# Patient Record
Sex: Female | Born: 2000 | Race: White | Hispanic: No | Marital: Single | Smoking: Never smoker
Health system: Southern US, Community
[De-identification: ages and names within clinical notes are randomized; demographics above are authoritative.]

## PROBLEM LIST (undated history)

## (undated) DIAGNOSIS — L309 Dermatitis, unspecified: Secondary | ICD-10-CM

---
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
## 2019-01-26 ENCOUNTER — Encounter: Payer: Self-pay | Admitting: Certified Nurse Midwife

## 2020-05-07 ENCOUNTER — Telehealth: Payer: 59 | Admitting: Physician Assistant

## 2020-05-07 ENCOUNTER — Encounter: Payer: Self-pay | Admitting: Physician Assistant

## 2020-05-07 DIAGNOSIS — N3000 Acute cystitis without hematuria: Secondary | ICD-10-CM

## 2020-05-07 MED ORDER — NITROFURANTOIN MONOHYD MACRO 100 MG PO CAPS: 100.0000 mg | ORAL_CAPSULE | Freq: Two times a day (BID) | ORAL | 0 refills | Status: AC

## 2020-05-07 NOTE — Progress Notes (Signed)
I connected with  Betty Stone on 05/07/20 by a video enabled telemedicine application and verified that I am speaking with the correct person using two identifiers.   I discussed the limitations of evaluation and management by telemedicine. The patient expressed understanding and agreed to proceed.    New Patient Office Visit  Subjective:  Patient ID: Betty Stone, female    DOB: 17-Mar-2001  Age: 20 y.o. MRN: [id number]  CC:  Chief Complaint  Patient presents with  . Urinary Tract Infection   Virtual Visit via Video Note  I connected with Betty Stone on 05/07/20 at  2:15 PM EST by a video enabled telemedicine application and verified that I am speaking with the correct person using two identifiers.  Patient was unable to connect through video, visit was conducted through audio only  Location: Patient: Home Provider: Working remotely from home   I discussed the limitations of evaluation and management by telemedicine and the availability of in person appointments. The patient expressed understanding and agreed to proceed.  History of Present Illness: Reports that she started having dysuria, increased frequency, urgency approximately 2 weeks ago.  Reports that she had an IUD inserted approximately 2 weeks ago as well.  Reports that she has been using AZO, increasing water intake with little relief.  Reports that she took an over-the-counter urinary tract infection test that showed that she was positive for a UTI.  Denies fever, back pain, vaginal discharge, change in odor   Observations/Objective: Medical history and current medications reviewed, no physical exam completed     Past Medical History:  Diagnosis Date  . Eczema     History reviewed. No pertinent surgical history.  Family History  Problem Relation Age of Onset  . Cancer Mother   . Breast cancer Maternal Grandmother   . Ovarian cancer Neg Hx   . Colon cancer Neg Hx     Social History   Socioeconomic  History  . Marital status: Single    Spouse name: Not on file  . Number of children: Not on file  . Years of education: Not on file  . Highest education level: Not on file  Occupational History  . Not on file  Tobacco Use  . Smoking status: Never Smoker  . Smokeless tobacco: Never Used  Vaping Use  . Vaping Use: Never used  Substance and Sexual Activity  . Alcohol use: No  . Drug use: No  . Sexual activity: Not Currently    Birth control/protection: Pill  Other Topics Concern  . Not on file  Social History Narrative  . Not on file   Social Determinants of Health   Financial Resource Strain: Not on file  Food Insecurity: Not on file  Transportation Needs: Not on file  Physical Activity: Not on file  Stress: Not on file  Social Connections: Not on file  Intimate Partner Violence: Not on file    ROS Review of Systems  Constitutional: Negative for chills and fever.  HENT: Negative.   Eyes: Negative.   Respiratory: Negative.   Cardiovascular: Negative.   Gastrointestinal: Negative for abdominal pain, nausea and vomiting.  Endocrine: Negative.   Genitourinary: Positive for dysuria, frequency and hematuria. Negative for genital sores, vaginal bleeding and vaginal discharge.  Musculoskeletal: Negative for back pain.  Skin: Negative.   Allergic/Immunologic: Negative.   Neurological: Negative for headaches.  Hematological: Negative.   Psychiatric/Behavioral: Negative.     Objective:   Today's Vitals: There were no vitals taken for this visit.  Assessment & Plan:   Problem List Items Addressed This Visit   None   Visit Diagnoses    Acute cystitis without hematuria    -  Primary   Relevant Medications   nitrofurantoin, macrocrystal-monohydrate, (MACROBID) 100 MG capsule      Outpatient Encounter Medications as of 05/07/2020  Medication Sig  . nitrofurantoin, macrocrystal-monohydrate, (MACROBID) 100 MG capsule Take 1 capsule (100 mg total) by mouth 2 (two)  times daily for 7 days.  . DENTA 5000 PLUS 1.1 % CREA dental cream BRUSH ONCE DAILY IN PLACE OF REGULAR TOOTHPASTE  . ibuprofen (ADVIL,MOTRIN) 600 MG tablet Take 1 tablet (600 mg total) by mouth every 8 (eight) hours as needed.  Marland Kitchen levonorgestrel-ethinyl estradiol (ALTAVERA) 0.15-30 MG-MCG tablet Take 1 tablet by mouth daily.  . naproxen (NAPROSYN) 250 MG tablet Take by mouth as needed.    No facility-administered encounter medications on file as of 05/07/2020.    Assessment and Plan: 1. Acute cystitis without hematuria Patient education given, explained to patient that since this was a virtual visit, we would not be able to send her urine for a culture for verification of bacteria, patient understands and agrees.  Trial Macrobid, continue symptomatic treatment.  Red flags given for prompt reevaluation - nitrofurantoin, macrocrystal-monohydrate, (MACROBID) 100 MG capsule; Take 1 capsule (100 mg total) by mouth 2 (two) times daily for 7 days.  Dispense: 14 capsule; Refill: 0   Follow Up Instructions:    I discussed the assessment and treatment plan with the patient. The patient was provided an opportunity to ask questions and all were answered. The patient agreed with the plan and demonstrated an understanding of the instructions.   The patient was advised to call back or seek an in-person evaluation if the symptoms worsen or if the condition fails to improve as anticipated.  I provided 20 minutes of face-to-face and non-face-to-face time during this encounter.    Follow-up: No follow-ups on file.   Kasandra Knudsen Lankford Gutzmer, PA-C

## 2020-05-07 NOTE — Patient Instructions (Signed)
You will take Macrobid twice a day for the next 7 days, I encourage you to continue drinking plenty of water and getting plenty of rest.  Please let us know if there is anything else we can do for you  Roney Jaffe, PA-C Physician Assistant [facility] [email or web address]    Urinary Tract Infection, Adult  A urinary tract infection (UTI) is an infection of any part of the urinary tract. The urinary tract includes the kidneys, ureters, bladder, and urethra. These organs make, store, and get rid of urine in the body. An upper UTI affects the ureters and kidneys. A lower UTI affects the bladder and urethra. What are the causes? Most urinary tract infections are caused by bacteria in your genital area around your urethra, where urine leaves your body. These bacteria grow and cause inflammation of your urinary tract. What increases the risk? You are more likely to develop this condition if:  You have a urinary catheter that stays in place.  You are not able to control when you urinate or have a bowel movement (incontinence).  You are female and you: ? Use a spermicide or diaphragm for birth control. ? Have low estrogen levels. ? Are pregnant.  You have certain genes that increase your risk.  You are sexually active.  You take antibiotic medicines.  You have a condition that causes your flow of urine to slow down, such as: ? An enlarged prostate, if you are female. ? Blockage in your urethra. ? A kidney stone. ? A nerve condition that affects your bladder control (neurogenic bladder). ? Not getting enough to drink, or not urinating often.  You have certain medical conditions, such as: ? Diabetes. ? A weak disease-fighting system (immunesystem). ? Sickle cell disease. ? Gout. ? Spinal cord injury. What are the signs or symptoms? Symptoms of this condition include:  Needing to urinate right away (urgency).  Frequent  urination. This may include small amounts of urine each time you urinate.  Pain or burning with urination.  Blood in the urine.  Urine that smells bad or unusual.  Trouble urinating.  Cloudy urine.  Vaginal discharge, if you are female.  Pain in the abdomen or the lower back. You may also have:  Vomiting or a decreased appetite.  Confusion.  Irritability or tiredness.  A fever or chills.  Diarrhea. The first symptom in older adults may be confusion. In some cases, they may not have any symptoms until the infection has worsened. How is this diagnosed? This condition is diagnosed based on your medical history and a physical exam. You may also have other tests, including:  Urine tests.  Blood tests.  Tests for STIs (sexually transmitted infections). If you have had more than one UTI, a cystoscopy or imaging studies may be done to determine the cause of the infections. How is this treated? Treatment for this condition includes:  Antibiotic medicine.  Over-the-counter medicines to treat discomfort.  Drinking enough water to stay hydrated. If you have frequent infections or have other conditions such as a kidney stone, you may need to see a health care provider who specializes in the urinary tract (urologist). In rare cases, urinary tract infections can cause sepsis. Sepsis is a life-threatening condition that occurs when the body responds to an infection. Sepsis is treated in the hospital with IV antibiotics, fluids, and other medicines. Follow these instructions at home: Medicines  Take over-the-counter and prescription medicines only as told by your health care provider.  If you were prescribed an antibiotic medicine, take it as told by your health care provider. Do not stop using the antibiotic even if you start to feel better. General instructions  Make sure you: ? Empty your bladder often and completely. Do not hold urine for long periods of time. ? Empty your  bladder after sex. ? Wipe from front to back after urinating or having a bowel movement if you are female. Use each tissue only one time when you wipe.  Drink enough fluid to keep your urine pale yellow.  Keep all follow-up visits. This is important.   Contact a health care provider if:  Your symptoms do not get better after 1-2 days.  Your symptoms go away and then return. Get help right away if:  You have severe pain in your back or your lower abdomen.  You have a fever or chills.  You have nausea or vomiting. Summary  A urinary tract infection (UTI) is an infection of any part of the urinary tract, which includes the kidneys, ureters, bladder, and urethra.  Most urinary tract infections are caused by bacteria in your genital area.  Treatment for this condition often includes antibiotic medicines.  If you were prescribed an antibiotic medicine, take it as told by your health care provider. Do not stop using the antibiotic even if you start to feel better.  Keep all follow-up visits. This is important. This information is not intended to replace advice given to you by your health care provider. Make sure you discuss any questions you have with your health care provider. Document Revised: 10/29/2019 Document Reviewed: 10/29/2019 Elsevier Patient Education  2021 [employer].

## 2020-05-08 ENCOUNTER — Emergency Department (HOSPITAL_BASED_OUTPATIENT_CLINIC_OR_DEPARTMENT_OTHER): Payer: 59

## 2020-05-08 ENCOUNTER — Emergency Department (HOSPITAL_BASED_OUTPATIENT_CLINIC_OR_DEPARTMENT_OTHER)
Admission: EM | Admit: 2020-05-08 | Discharge: 2020-05-08 | Disposition: A | Discharge status: Home / Self Care | Payer: 59 | Attending: Emergency Medicine | Admitting: Emergency Medicine

## 2020-05-08 ENCOUNTER — Encounter (HOSPITAL_BASED_OUTPATIENT_CLINIC_OR_DEPARTMENT_OTHER): Payer: Self-pay | Admitting: *Deleted

## 2020-05-08 ENCOUNTER — Other Ambulatory Visit: Payer: Self-pay

## 2020-05-08 DIAGNOSIS — M542 Cervicalgia: Secondary | ICD-10-CM | POA: Diagnosis present

## 2020-05-08 DIAGNOSIS — M545 Low back pain, unspecified: Secondary | ICD-10-CM | POA: Insufficient documentation

## 2020-05-08 DIAGNOSIS — Y92410 Unspecified street and highway as the place of occurrence of the external cause: Secondary | ICD-10-CM | POA: Insufficient documentation

## 2020-05-08 MED ORDER — ACETAMINOPHEN 325 MG PO TABS
650.0000 mg | ORAL_TABLET | Freq: Once | ORAL | Status: AC
  Administered 2020-05-08: 650 mg via ORAL
  Filled 2020-05-08: qty 2

## 2020-05-08 NOTE — Discharge Instructions (Signed)
Call your primary care doctor or specialist as discussed in the next 2-3 days.   Return immediately back to the ER if:  Your symptoms worsen within the next 12-24 hours. You develop new symptoms such as new fevers, persistent vomiting, new pain, shortness of breath, or new weakness or numbness, or if you have any other concerns.  
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
## 2020-05-28 ENCOUNTER — Telehealth: Payer: 59 | Admitting: Oncology

## 2020-05-28 DIAGNOSIS — N3000 Acute cystitis without hematuria: Secondary | ICD-10-CM | POA: Diagnosis not present

## 2020-05-28 MED ORDER — SULFAMETHOXAZOLE-TRIMETHOPRIM 800-160 MG PO TABS: 1.0000 | ORAL_TABLET | Freq: Two times a day (BID) | ORAL | 0 refills | Status: DC

## 2020-05-28 MED ORDER — PHENAZOPYRIDINE HCL 100 MG PO TABS: 100.0000 mg | ORAL_TABLET | Freq: Three times a day (TID) | ORAL | 0 refills | Status: DC | PRN

## 2020-05-28 NOTE — Patient Instructions (Signed)
Phenazopyridine tablets What is this medicine? PHENAZOPYRIDINE (fen az oh PEER i deen) is a pain reliever. It is used to stop the pain, burning, or discomfort caused by infection or irritation of the urinary tract. This medicine is not an antibiotic. It will not cure a urinary tract infection. This medicine may be used for other purposes; ask your health care provider or pharmacist if you have questions. COMMON BRAND NAME(S): AZO, Azo-100, Azo-Gesic, Azo-Septic, Azo-Standard, Phenazo, Prodium, Pyridium, Urinary Analgesic, Uristat, Uristat Ultra What should I tell my health care provider before I take this medicine? They need to know if you have any of these conditions:  glucose-6-phosphate dehydrogenase (G6PD) deficiency  kidney disease  an unusual or allergic reaction to phenazopyridine, other medicines, foods, dyes, or preservatives  pregnant or trying to get pregnant  breast-feeding How should I use this medicine? Take this medicine by mouth with a glass of water. Follow the directions on the prescription label. Take after meals. Take your doses at regular intervals. Do not take your medicine more often than directed. Do not skip doses or stop your medicine early even if you feel better. Do not stop taking except on your doctor's advice. Talk to your pediatrician regarding the use of this medicine in children. Special care may be needed. Overdosage: If you think you have taken too much of this medicine contact a poison control center or emergency room at once. NOTE: This medicine is only for you. Do not share this medicine with others. What if I miss a dose? If you miss a dose, take it as soon as you can. If it is almost time for your next dose, take only that dose. Do not take double or extra doses. What may interact with this medicine? Interactions are not expected. This list may not describe all possible interactions. Give your health care provider a list of all the medicines, herbs,  non-prescription drugs, or dietary supplements you use. Also tell them if you smoke, drink alcohol, or use illegal drugs. Some items may interact with your medicine. What should I watch for while using this medicine? Tell your doctor or health care professional if your symptoms do not improve or if they get worse. This medicine colors body fluids red. This effect is harmless and will go away after you are done taking the medicine. It will change urine to an dark orange or red color. The red color may stain clothing. Soft contact lenses may become permanently stained. It is best not to wear soft contact lenses while taking this medicine. If you are diabetic you may get a false positive result for sugar in your urine. Talk to your health care provider. What side effects may I notice from receiving this medicine? Side effects that you should report to your doctor or health care professional as soon as possible:  allergic reactions like skin rash, itching or hives, swelling of the face, lips, or tongue  blue or purple color of the skin  difficulty breathing  fever  less urine  unusual bleeding, bruising  unusual tired, weak  vomiting  yellowing of the eyes or skin Side effects that usually do not require medical attention (report to your doctor or health care professional if they continue or are bothersome):  dark urine  headache  stomach upset This list may not describe all possible side effects. Call your doctor for medical advice about side effects. You may report side effects to FDA at 1-800-FDA-1088. Where should I keep my medicine? Keep  out of the reach of children. Store at room temperature between 15 and 30 degrees C (59 and 86 degrees F). Protect from light and moisture. Throw away any unused medicine after the expiration date. NOTE: This sheet is a summary. It may not cover all possible information. If you have questions about this medicine, talk to your doctor, pharmacist,  or health care provider.  2021 Elsevier/Gold Standard (2007-10-15 11:04:07) Sulfamethoxazole; Trimethoprim, SMX-TMP tablets What is this medicine? SULFAMETHOXAZOLE; TRIMETHOPRIM or SMX-TMP (suhl fuh meth OK suh zohl; trye METH oh prim) is a combination of a sulfonamide antibiotic and a second antibiotic, trimethoprim. It is used to treat or prevent certain kinds of bacterial infections. It will not work for colds, flu, or other viral infections. This medicine may be used for other purposes; ask your health care provider or pharmacist if you have questions. COMMON BRAND NAME(S): Bacter-Aid DS, Bactrim, Bactrim DS, Septra, Septra DS What should I tell my health care provider before I take this medicine? They need to know if you have any of these conditions:  G6PD deficiency  HIV or AIDS  kidney disease  liver disease  low platelets  low red blood cell counts  poor nutrition  stomach or intestine problems like colitis  thyroid disease  an unusual or allergic reaction to sulfamethoxazole, trimethoprim, sulfa drugs, other drugs, foods, dyes, or preservatives  pregnant or trying to get pregnant  breast-feeding How should I use this medicine? Take this medicine by mouth with a glass of water. Follow the directions on the prescription label. Take your medicine at regular intervals. Do not take it more often than directed. Take all of your medicine as directed even if you think you are better. Do not skip doses or stop your medicine early. Talk to your pediatrician regarding the use of this medicine in children. While this drug may be prescribed for children as young as 2 months for selected conditions, precautions do apply. Overdosage: If you think you have taken too much of this medicine contact a poison control center or emergency room at once. NOTE: This medicine is only for you. Do not share this medicine with others. What if I miss a dose? If you miss a dose, take it as soon as  you can. If it is almost time for your next dose, take only that dose. Do not take double or extra doses. What may interact with this medicine? Do not take this medicine with any of the following medications:  dofetilide This medicine may also interact with the following medications:  amantadine  birth control pills  certain medicines for blood pressure, heart disease  certain medicines for depression, like amitriptyline  certain medicines for diabetes, like glipizide or glyburide  certain medicines that treat or prevent blood clots like warfarin  cyclosporine  digoxin  diuretics  indomethacin  methotrexate  phenytoin  procainamide  pyrimethamine  zidovudine This list may not describe all possible interactions. Give your health care provider a list of all the medicines, herbs, non-prescription drugs, or dietary supplements you use. Also tell them if you smoke, drink alcohol, or use illegal drugs. Some items may interact with your medicine. What should I watch for while using this medicine? Tell your health care provider if your symptoms do not start to get better or if they get worse. Do not treat diarrhea with over the counter products. Contact your health care provider if you have diarrhea that lasts more than 2 days or if it is severe and  watery. This drug may cause serious skin reactions. They can happen weeks to months after starting the drug. Contact your health care provider right away if you notice fevers or flu-like symptoms with a rash. The rash may be red or purple and then turn into blisters or peeling of the skin. Or, you might notice a red rash with swelling of the face, lips or lymph nodes in your neck or under your arms. This drug can make you more sensitive to the sun. Keep out of the sun. If you cannot avoid being in the sun, wear protective clothing and sunscreen. Do not use sun lamps or tanning beds/booths. Be careful brushing or flossing your teeth or  using a toothpick because you may get an infection or bleed more easily. If you have any dental work done, tell your dentist you are receiving this drug. What side effects may I notice from receiving this medicine? Side effects that you should report to your doctor or health care professional as soon as possible:  allergic reactions (skin rash, itching or hives; swelling of the face, lips, or tongue)  bloody or watery diarrhea  cough  heartbeat rhythm changes (trouble breathing; chest pain; dizziness; fast, irregular heartbeat; feeling faint or lightheaded, falls,)  fever  high potassium levels (chest pain; fast, irregular heartbeat; muscle weakness)  kidney injury (trouble passing urine or change in the amount of urine)  liver injury (dark yellow or brown urine; general ill feeling or flu-like symptoms; loss of appetite, right upper belly pain; unusually weak or tired, yellowing of the eyes or skin)  low blood pressure (dizziness; feeling faint or lightheaded, falls; unusually weak or tired)  low blood sugar (feeling anxious; confusion; dizziness; increased hunger; unusually weak or tired; increased sweating; shakiness; cold, clammy skin; irritable; headache; blurred vision; fast heartbeat; loss of consciousness)  low red blood cell counts (trouble breathing; feeling faint; lightheaded, falls; unusually weak or tired)  rash, fever, and swollen lymph nodes  redness, blistering, peeling, or loosening of the skin, including inside the mouth  trouble breathing  unusual bruising or bleeding Side effects that usually do not require medical attention (report to your doctor or health care professional if they continue or are bothersome):  lack or loss of appetite  nausea  vomiting This list may not describe all possible side effects. Call your doctor for medical advice about side effects. You may report side effects to FDA at 1-800-FDA-1088. Where should I keep my medicine? Keep  out of the reach of children. Store between 15 and 25 degrees C (59 to 77 degrees F). Protect from light. Keep the container tightly closed. Throw away any unused drug after the expiration date. NOTE: This sheet is a summary. It may not cover all possible information. If you have questions about this medicine, talk to your doctor, pharmacist, or health care provider.  2021 Elsevier/Gold Standard (2019-02-19 09:54:22)

## 2020-05-28 NOTE — Progress Notes (Signed)
[name], Betty Stone are scheduled for a virtual visit with your provider today.    Just as we do with appointments in the office, we must obtain your consent to participate.  Your consent will be active for this visit and any virtual visit you may have with one of our providers in the next 365 days.    If you have a MyChart account, I can also send a copy of this consent to you electronically.  All virtual visits are billed to your insurance company just like a traditional visit in the office.  As this is a virtual visit, video technology does not allow for your provider to perform a traditional examination.  This may limit your provider's ability to fully assess your condition.  If your provider identifies any concerns that need to be evaluated in person or the need to arrange testing such as labs, EKG, etc, we will make arrangements to do so.    Although advances in technology are sophisticated, we cannot ensure that it will always work on either your end or our end.  If the connection with a video visit is poor, we may have to switch to a telephone visit.  With either a video or telephone visit, we are not always able to ensure that we have a secure connection.   I need to obtain your verbal consent now.   Are you willing to proceed with your visit today?   Betty Stone has provided verbal consent on 05/28/2020 for a virtual visit (video or telephone).   Mauro Kaufmann, NP 05/28/2020  6:23 PM  Virtual Visit via Video Note  I connected with Betty Stone on 05/28/20 at  6:15 PM EST by a video enabled telemedicine application and verified that I am speaking with the correct person using two identifiers.  Location: Patient: Home Provider: Home Office   I discussed the limitations of evaluation and management by telemedicine and the availability of in person appointments. The patient expressed understanding and agreed to proceed.  History of Present Illness: Betty Stone is a 20 year old female with  past medical history significant for acute cystitis without hematuria and just menorrhagia in adolescence who presents for [facility] virtual visit today for symptoms of a UTI.  She was recently seen at [facility].  Imaging was negative.  States she was recently seen for UTI-like symptoms and given a prescription for nitrofurantoin.  Symptoms improved after a few days and she stopped taking her antibiotic.  This was approximately 2 weeks ago.  Symptoms started to return 1 to 2 days ago.  States she has developed some dysuria but no increased urgency or hematuria.  Denies any fever or pelvic pain.  She is worried the urinary tract infection will come back.   Observations/Objective: Review of Systems  Genitourinary: Positive for dysuria. Negative for flank pain, frequency, hematuria and urgency.   Assessment and Plan: Rebound urinary tract infection: -Secondary to not completing course of nitrofurantoin. -Took a home UTI test which was positive over 2 to 3 weeks ago. -Denies any fever, back pain, vaginal discharge or odor. -Recommend Bactrim twice daily for 5 to 7 days.  Counseled on importance of completing course of antibiotics. -Recommend Pyridium for dysuria.  Counseled that this medication will turn her urine orange. -Both prescription sent to pharmacy.  Follow Up Instructions: -Recommend follow-up with PCP.   I discussed the assessment and treatment plan with the patient. The patient was provided an opportunity to ask questions and  all were answered. The patient agreed with the plan and demonstrated an understanding of the instructions.   The patient was advised to call back or seek an in-person evaluation if the symptoms worsen or if the condition fails to improve as anticipated.  I provided 9 minutes of face-to-face time during this encounter.   Mauro Kaufmann, NP

## 2021-01-09 ENCOUNTER — Telehealth: Payer: 59 | Admitting: Physician Assistant

## 2021-01-09 DIAGNOSIS — B9689 Other specified bacterial agents as the cause of diseases classified elsewhere: Secondary | ICD-10-CM

## 2021-01-09 DIAGNOSIS — J019 Acute sinusitis, unspecified: Secondary | ICD-10-CM

## 2021-01-09 MED ORDER — DOXYCYCLINE HYCLATE 100 MG PO TABS: 100.0000 mg | ORAL_TABLET | Freq: Two times a day (BID) | ORAL | 0 refills | Status: DC

## 2021-01-09 NOTE — Progress Notes (Signed)

## 2021-01-09 NOTE — Progress Notes (Signed)
I have spent 5 minutes in review of e-visit questionnaire, review and updating patient chart, medical decision making and response to patient.   Cleopha Indelicato Cody Kimari Lienhard, PA-C    

## 2021-01-19 ENCOUNTER — Telehealth: Payer: 59 | Admitting: Physician Assistant

## 2021-01-19 DIAGNOSIS — J020 Streptococcal pharyngitis: Secondary | ICD-10-CM

## 2021-01-19 MED ORDER — LIDOCAINE VISCOUS HCL 2 % MT SOLN: OROMUCOSAL | 0 refills | Status: DC

## 2021-01-19 MED ORDER — PREDNISONE 20 MG PO TABS: 20.0000 mg | ORAL_TABLET | Freq: Every day | ORAL | 0 refills | Status: DC

## 2021-01-19 MED ORDER — AZITHROMYCIN 250 MG PO TABS: ORAL_TABLET | ORAL | 0 refills | Status: DC

## 2021-01-19 MED ORDER — IPRATROPIUM BROMIDE 0.03 % NA SOLN: 2.0000 | Freq: Two times a day (BID) | NASAL | 0 refills | Status: DC

## 2021-01-19 NOTE — Patient Instructions (Signed)
[employer], thank you for joining Margaretann Loveless, PA-C for today's virtual visit.  While this provider is not your primary care provider (PCP), if your PCP is located in our provider database this encounter information will be shared with them immediately following your visit.  Consent: (Patient) Yesly Hyslop provided verbal consent for this virtual visit at the beginning of the encounter.  Current Medications:  Current Outpatient Medications:    azithromycin (ZITHROMAX) 250 MG tablet, Take 2 tablets PO on day one, and one tablet PO daily thereafter until completed., Disp: 6 tablet, Rfl: 0   ipratropium (ATROVENT) 0.03 % nasal spray, Place 2 sprays into both nostrils every 12 (twelve) hours., Disp: 30 mL, Rfl: 0   lidocaine (XYLOCAINE) 2 % solution, 5 mL swish and gargle for 15-20 seconds then swallow every 4 hours as needed for throat pain, Disp: 100 mL, Rfl: 0   predniSONE (DELTASONE) 20 MG tablet, Take 1 tablet (20 mg total) by mouth daily with breakfast., Disp: 5 tablet, Rfl: 0   doxycycline (VIBRA-TABS) 100 MG tablet, Take 1 tablet (100 mg total) by mouth 2 (two) times daily., Disp: 20 tablet, Rfl: 0   phenazopyridine (PYRIDIUM) 100 MG tablet, Take 1 tablet (100 mg total) by mouth 3 (three) times daily as needed for pain., Disp: 10 tablet, Rfl: 0   Medications ordered in this encounter:  Meds ordered this encounter  Medications   azithromycin (ZITHROMAX) 250 MG tablet    Sig: Take 2 tablets PO on day one, and one tablet PO daily thereafter until completed.    Dispense:  6 tablet    Refill:  0    Order Specific Question:   Supervising Provider    Answer:   MILLER, BRIAN [[id number]]   predniSONE (DELTASONE) 20 MG tablet    Sig: Take 1 tablet (20 mg total) by mouth daily with breakfast.    Dispense:  5 tablet    Refill:  0    Order Specific Question:   Supervising Provider    Answer:   MILLER, BRIAN [[id number]]   lidocaine (XYLOCAINE) 2 % solution    Sig: 5 mL swish and gargle for  15-20 seconds then swallow every 4 hours as needed for throat pain    Dispense:  100 mL    Refill:  0    Order Specific Question:   Supervising Provider    Answer:   MILLER, BRIAN [[id number]]   ipratropium (ATROVENT) 0.03 % nasal spray    Sig: Place 2 sprays into both nostrils every 12 (twelve) hours.    Dispense:  30 mL    Refill:  0    Order Specific Question:   Supervising Provider    Answer:   Hyacinth Meeker, BRIAN [[id number]]     *If you need refills on other medications prior to your next appointment, please contact your pharmacy*  Follow-Up: Call back or seek an in-person evaluation if the symptoms worsen or if the condition fails to improve as anticipated.  Other Instructions Strep Throat, Adult Strep throat is an infection in the throat that is caused by bacteria. It is common during the cold months of the year. It mostly affects children who are 63-26 years old. However, people of all ages can get it at any time of the year. This infection spreads from person to person (is contagious) through coughing, sneezing, or having close contact. Your health care provider may use other names to describe the infection. It can be called tonsillitis (if there is swelling  of the tonsils), or pharyngitis (if there is swelling at the back of the throat). What are the causes? This condition is caused by the Streptococcus pyogenes bacteria. What increases the risk? You are more likely to develop this condition if: You care for school-age children, or are around school-age children. Children are more likely to get strep throat and may spread it to others. You spend time in crowded places where the infection can spread easily. You have close contact with someone who has strep throat. What are the signs or symptoms? Symptoms of this condition include: Fever or chills. Redness, swelling, or pain in the tonsils or throat. Pain or difficulty when swallowing. White or yellow spots on the tonsils or throat. Tender  glands in the neck and under the jaw. Bad smelling breath. Red rash all over the body. This is rare. How is this diagnosed? This condition is diagnosed by tests that check for the presence and the amount of bacteria that cause strep throat. They are: Rapid strep test. Your throat is swabbed and checked for the presence of bacteria. Results are usually ready in minutes. Throat culture test. Your throat is swabbed. The sample is placed in a cup that allows infections to grow. Results are usually ready in 1 or 2 days. How is this treated? This condition may be treated with: Medicines that kill germs (antibiotics). Medicines that relieve pain or fever. These include: Ibuprofen or acetaminophen. Aspirin, only for patients who are over the age of 34. Throat lozenges. Throat sprays. Follow these instructions at home: Medicines  Take over-the-counter and prescription medicines only as told by your health care provider. Take your antibiotic medicine as told by your health care provider. Do not stop taking the antibiotic even if you start to feel better. Eating and drinking  If you have trouble swallowing, try eating soft foods until your sore throat feels better. Drink enough fluid to keep your urine pale yellow. To help relieve pain, you may have: Warm fluids, such as soup and tea. Cold fluids, such as frozen desserts or popsicles. General instructions Gargle with a salt-water mixture 3-4 times a day or as needed. To make a salt-water mixture, completely dissolve -1 tsp (3-6 g) of salt in 1 cup (237 mL) of warm water. Get plenty of rest. Stay home from work or school until you have been taking antibiotics for 24 hours. Avoid smoking or being around people who smoke. Keep all follow-up visits as told by your health care provider. This is important. How is this prevented?  Do not share food, drinking cups, or personal items that could cause the infection to spread to other people. Wash  your hands well with soap and water, and make sure that all people in your house wash their hands well. Have family members tested if they have a sore throat or fever. They may need an antibiotic if they have strep throat. Contact a health care provider if: The glands in your neck continue to get bigger. You develop a rash, cough, or earache. You cough up a thick mucus that is green, yellow-brown, or bloody. You have pain or discomfort that does not get better with medicine. Your symptoms seem to be getting worse and not better. You have a fever. Get help right away if: You have new symptoms, such as vomiting, severe headache, stiff or painful neck, chest pain, or shortness of breath. You have severe throat pain, drooling, or changes in your voice. You have swelling of the neck,  or the skin on the neck becomes red and tender. You have signs of dehydration, such as tiredness (fatigue), dry mouth, and decreased urination. You become increasingly sleepy, or you cannot wake up completely. Your joints become red or painful. Summary Strep throat is an infection in the throat that is caused by the Streptococcus pyogenes bacteria. This infection is spread from person to person (is contagious) through coughing, sneezing, or having close contact. Take your medicines, including antibiotics, as told by your health care provider. Do not stop taking the antibiotic even if you start to feel better. To prevent the spread of germs, wash your hands well with soap and water. Have others do the same. Do not share food, drinking cups, or personal items. Get help right away if you have new symptoms, such as vomiting, severe headache, stiff or painful neck, chest pain, or shortness of breath. This information is not intended to replace advice given to you by your health care provider. Make sure you discuss any questions you have with your health care provider. Document Revised: 06/05/2018 Document Reviewed:  06/05/2018 Elsevier Patient Education  2022 [employer].    If you have been instructed to have an in-person evaluation today at a local Urgent Care facility, please use the link below. It will take you to a list of all of our available [facility], including address, phone number and hours of operation. Please do not delay care.  [facility]  If you or a family member do not have a primary care provider, use the link below to schedule a visit and establish care. When you choose a [facility] primary care physician or advanced practice provider, you gain a long-term partner in health. Find a Primary Care Provider  Learn more about [facility]'s in-office and virtual care options: [facility] - Get Care Now

## 2021-01-19 NOTE — Progress Notes (Signed)
Virtual Visit Consent   Betty Stone, you are scheduled for a virtual visit with a [facility] provider today.     Just as with appointments in the office, your consent must be obtained to participate.  Your consent will be active for this visit and any virtual visit you may have with one of our providers in the next 365 days.     If you have a MyChart account, a copy of this consent can be sent to you electronically.  All virtual visits are billed to your insurance company just like a traditional visit in the office.    As this is a virtual visit, video technology does not allow for your provider to perform a traditional examination.  This may limit your provider's ability to fully assess your condition.  If your provider identifies any concerns that need to be evaluated in person or the need to arrange testing (such as labs, EKG, etc.), we will make arrangements to do so.     Although advances in technology are sophisticated, we cannot ensure that it will always work on either your end or our end.  If the connection with a video visit is poor, the visit may have to be switched to a telephone visit.  With either a video or telephone visit, we are not always able to ensure that we have a secure connection.     I need to obtain your verbal consent now.   Are you willing to proceed with your visit today?    Kinleigh Glasby has provided verbal consent on 01/19/2021 for a virtual visit (video or telephone).   Margaretann Loveless, PA-C   Date: 01/19/2021 3:47 PM   Virtual Visit via Video Note   I, Margaretann Loveless, connected with  Betty Stone  ([id number], 11-25-2000) on 01/19/21 at  3:15 PM EDT by a video-enabled telemedicine application and verified that I am speaking with the correct person using two identifiers.  Location: Patient: Virtual Visit Location Patient: Home Provider: Virtual Visit Location Provider: Home Office   I discussed the limitations of evaluation and management by  telemedicine and the availability of in person appointments. The patient expressed understanding and agreed to proceed.    History of Present Illness: Betty Stone is a 20 y.o. who identifies as a female who was assigned female at birth, and is being seen today for continued sore throat issues. Reports she has had a sore throat for several weeks. Was seen on 01/09/21 and treated with Doxycycline for 10 days. Reports was starting to improve but worsened once stopping doxycycline. Does have continued post nasal drainage and rhinorrhea. Also with pain radiating into ears.    Problems:  Patient Active Problem List   Diagnosis Date Noted   Acute cystitis without hematuria 05/07/2020   Dysmenorrhea in adolescent 09/25/2017    Allergies:  Allergies  Allergen Reactions   Amoxicillin    Cefdinir Rash   Medications:  Current Outpatient Medications:    azithromycin (ZITHROMAX) 250 MG tablet, Take 2 tablets PO on day one, and one tablet PO daily thereafter until completed., Disp: 6 tablet, Rfl: 0   ipratropium (ATROVENT) 0.03 % nasal spray, Place 2 sprays into both nostrils every 12 (twelve) hours., Disp: 30 mL, Rfl: 0   lidocaine (XYLOCAINE) 2 % solution, 5 mL swish and gargle for 15-20 seconds then swallow every 4 hours as needed for throat pain, Disp: 100 mL, Rfl: 0   predniSONE (DELTASONE) 20 MG tablet, Take 1 tablet (20  mg total) by mouth daily with breakfast., Disp: 5 tablet, Rfl: 0   doxycycline (VIBRA-TABS) 100 MG tablet, Take 1 tablet (100 mg total) by mouth 2 (two) times daily., Disp: 20 tablet, Rfl: 0   phenazopyridine (PYRIDIUM) 100 MG tablet, Take 1 tablet (100 mg total) by mouth 3 (three) times daily as needed for pain., Disp: 10 tablet, Rfl: 0  Observations/Objective: Patient is well-developed, well-nourished in no acute distress.  Resting comfortably at home.  Head is normocephalic, atraumatic.  No labored breathing.  Speech is clear and coherent with logical content.  Patient  is alert and oriented at baseline.  Tonisllar exudate present bilaterally (R.L) and posterior pharynx (photos in media)  Assessment and Plan: 1. Strep pharyngitis - azithromycin (ZITHROMAX) 250 MG tablet; Take 2 tablets PO on day one, and one tablet PO daily thereafter until completed.  Dispense: 6 tablet; Refill: 0 - predniSONE (DELTASONE) 20 MG tablet; Take 1 tablet (20 mg total) by mouth daily with breakfast.  Dispense: 5 tablet; Refill: 0 - lidocaine (XYLOCAINE) 2 % solution; 5 mL swish and gargle for 15-20 seconds then swallow every 4 hours as needed for throat pain  Dispense: 100 mL; Refill: 0 - ipratropium (ATROVENT) 0.03 % nasal spray; Place 2 sprays into both nostrils every 12 (twelve) hours.  Dispense: 30 mL; Refill: 0  - Will give Zpak for strep throat, but advised MUST be seen in person if treatment fails again, would recommend throat cultures - Viscous lidocaine and prednisone for throat pain and swelling - Ipratropium nasal spray for drainage - Tylenol PRN  - Push fluids - Rest - Seek in person evaluation if not improving or worsening  Follow Up Instructions: I discussed the assessment and treatment plan with the patient. The patient was provided an opportunity to ask questions and all were answered. The patient agreed with the plan and demonstrated an understanding of the instructions.  A copy of instructions were sent to the patient via MyChart unless otherwise noted below.    The patient was advised to call back or seek an in-person evaluation if the symptoms worsen or if the condition fails to improve as anticipated.  Time:  I spent 13 minutes with the patient via telehealth technology discussing the above problems/concerns.    Margaretann Loveless, PA-C
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
## 2021-12-05 DIAGNOSIS — N898 Other specified noninflammatory disorders of vagina: Secondary | ICD-10-CM | POA: Diagnosis not present

## 2021-12-05 DIAGNOSIS — J358 Other chronic diseases of tonsils and adenoids: Secondary | ICD-10-CM | POA: Diagnosis not present

## 2021-12-05 DIAGNOSIS — M25541 Pain in joints of right hand: Secondary | ICD-10-CM | POA: Diagnosis not present

## 2021-12-05 DIAGNOSIS — R5382 Chronic fatigue, unspecified: Secondary | ICD-10-CM | POA: Diagnosis not present

## 2021-12-05 DIAGNOSIS — R5381 Other malaise: Secondary | ICD-10-CM | POA: Diagnosis not present

## 2022-01-03 DIAGNOSIS — N898 Other specified noninflammatory disorders of vagina: Secondary | ICD-10-CM | POA: Diagnosis not present

## 2022-02-04 DIAGNOSIS — R69 Illness, unspecified: Secondary | ICD-10-CM | POA: Diagnosis not present

## 2022-05-14 ENCOUNTER — Ambulatory Visit (HOSPITAL_COMMUNITY)
Admission: EM | Admit: 2022-05-14 | Discharge: 2022-05-14 | Disposition: A | Discharge status: Home / Self Care | Payer: 59

## 2022-05-14 DIAGNOSIS — R45851 Suicidal ideations: Secondary | ICD-10-CM

## 2022-05-14 DIAGNOSIS — R69 Illness, unspecified: Secondary | ICD-10-CM | POA: Diagnosis not present

## 2022-05-14 DIAGNOSIS — Z046 Encounter for general psychiatric examination, requested by authority: Secondary | ICD-10-CM | POA: Diagnosis not present

## 2022-05-14 NOTE — ED Provider Notes (Signed)
Behavioral Health Urgent Care Medical Screening Exam  Patient Name: Betty Stone MRN: [id number] Date of Evaluation: 05/14/22 Chief Complaint: brought In under IVC,  "I knew my mom was going to do this , she does anything she can to hurt me and mess up my life". Diagnosis:  Final diagnoses:  Involuntary commitment  Passive suicidal ideations    History of Present illness: Betty Stone is a 22 y.o. female patient presented to [facility] as a walk in twice daily.  Involuntary commitment with complaints of, "I knew my mom was going to do this , she does anything she can to hurt me and mess up my life".  IVC petitioner Betty Stone ([phone] findings are as follows "respondent is texting to boyfriend.  She is going to kill herself.  6:46 PM she said "I am going to kill myself. I can't do this anymore. I'm gonna be gone by the time anyone gets here. I wont be alive."Has access to oxycodone".  Betty Stone, 22 y.o., female patient seen face to face by this provider, consulted with Dr. Dwyane Dee; and chart reviewed on 05/14/22.  Per chart review there is no psychiatric history documented.  Patient denies being diagnosed with any psychiatric illness.  She does not take any medications.  She denies any health concerns.  She does have a therapists she sees every 2 weeks due to issues that she has with her mother.  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  She admits to drinking a little on the weekends but denies having any type of substance issue.  Other than alcohol she denies all other substance use.  On evaluation Betty Stone reports yesterday she got into a huge argument with her boyfriend and felt overwhelmed. During that argument she did admit to texting her boyfriend and telling him that she was going to kill herself. However, she only did this out of anger that she never meant it.  States, "I would never do anything to hurt  myself I have too much to look forward to".  She has aspirations to become a nurse and would never do anything to hurt her family.  She admits that her relationship with her mother is extremely strained. Her mother has committed multiple involuntary commitments on her brother and her father in the past.  Her mother is diagnosed with bipolar and has her own mental health issues and she likes to [place] in everyone else's life.  She is aware that her boyfriend did call her mother yesterday and sent the text after their argument.  At this time the relationship with her boyfriend is intact and they were together yesterday after the argument and spent a good day and night together.  Reports her mother called her and told her that the police were going to be waiting on her so she knew that she was going to be involuntarily committed.  States, "if I really was going to hurt myself I could have already done it by now, I did not mean it and I would not do it".  She adamantly contracts verbally for safety.  She denies access to firearms/weapons.  She does have an old bottle of oxycodone from where she had a cyst to erupt in her ovaries.  She denies using medication and agrees to have her boyfriend removed the medications from her home.  Discussed the gravity of making suicidal comments.  Verbalized understanding.  She  is educated on suicides prevention/intervention.She is educated and verbalizes understanding of mental health resources and other crisis services in the community.  She is instructed to call 911, call/text [phone] and present to the nearest emergency room should she experience any suicidal/homicidal ideation, auditory/visual/hallucinations, or detrimental worsening of her mental health condition.    During evaluation Betty Stone is observed sitting in the assessment room in no acute distress.  She is dressed in scrubs as the per new protocol for patients were brought in under IVC.  She is alert/oriented x 4,  cooperative, and attentive.  Her speech and behavior is normal.  She is pleasant and calm throughout the assessment.  She does tear up when discussing the relationship between she and her mother.  States, "if my mother was so concerned about me why didnt she call me, she never even asked if I was okay she just went and performed the IVC".  She is denying any depression or anxiety.  However she does state that being in the locked facility does make her feel a bit anxious.  She denies SI/HI/AVH. Objectively there is no evidence of psychosis/mania or delusional thinking. Patient is able to converse coherently, goal directed thoughts, no distractibility, or pre-occupation.  Patient answered question appropriately.    Call contact Betty Stone [phone] (mother) states that she felt because her daughter made this comment that she had to do something about it.  She admits she does not believe that her daughter is a harm to herself but, "she has got to realized that you cannot make these harsh statements like this, there are people out there who truly do need help".  She has no immediate safety concerns with patient being discharged home.  In fact she states, "I was hoping that you guys would not keep her I do not want her to stay there".  She is very upset with patient's boyfriend.  Reports he causes a lot of friction and problems between her and her daughter, "he plots [place] against each other".  Reports her daughter's boyfriend has even gone as far as sending her a message on Facebook stating that he wanted to have sex with her (mother).  She is also very concerned that patient works in a bar/nightclub as a Estate agent.  States this is not the best place for a young pretty girl to be.  Call contact [phone] Betty Stone (boyfriend).  [name] states patient did make a comment that she was going to kill herself after an argument that they had.  He admits since that comment they have made up and he is confident that patient  only made that comment because she was angry at him.  He states, "she would never do anything to hurt herself she just would not".  He admits that he did call patient's mom to let her know about the text.  He was not aware that her mother would take the text so seriously and go down and perform an involuntary commitment.  He has no immediate safety concerns with patient being discharged home.  Informed that patient agrees to allow him to remove the oxycodone that is in patient's home.  Discharge patient and resend IVC.  Patient will follow-up with her therapist within the week.  [place] ED from 05/08/2020 in [facility] Emergency Department at [place] [facility]       Psychiatric Specialty Exam  Presentation  General Appearance:Appropriate for Environment; Casual  Eye Contact:Good  Speech:Clear  and Coherent; Normal Rate  Speech Volume:Normal  Handedness:No data recorded  Mood and Affect  Mood: Anxious  Affect: Congruent   Thought Process  Thought Processes: Coherent  Descriptions of Associations:Intact  Orientation:Full (Time, Place and Person)  Thought Content:Logical    Hallucinations:None  Ideas of Reference:None  Suicidal Thoughts:No data recorded Homicidal Thoughts:No data recorded  Sensorium  Memory: Immediate Good; Recent Good; Remote Good  Judgment: Good  Insight: Good   Executive Functions  Concentration: Good  Attention Span: Good  Recall: Good  Fund of Knowledge: Good  Language: Good   Psychomotor Activity  Psychomotor Activity: Normal   Assets  Assets: Communication Skills; Desire for Improvement; Financial Resources/Insurance; Physical Health; Resilience; Social Support; Housing; Vocational/Educational   Sleep  Sleep: Good  Number of hours: No data recorded  Physical Exam: Physical Exam Vitals and nursing note reviewed.  Constitutional:      General: She is not in acute  distress.    Appearance: Normal appearance. She is not ill-appearing.  HENT:     Head: Normocephalic.  Eyes:     General:        Right eye: No discharge.        Left eye: No discharge.     Conjunctiva/sclera: Conjunctivae normal.  Cardiovascular:     Rate and Rhythm: Normal rate.  Pulmonary:     Effort: Pulmonary effort is normal. No respiratory distress.  Musculoskeletal:        General: Normal range of motion.     Cervical back: Normal range of motion.  Skin:    Coloration: Skin is not jaundiced or pale.  Neurological:     Mental Status: She is alert and oriented to person, place, and time.  Psychiatric:        Attention and Perception: Attention and perception normal.        Mood and Affect: Affect normal. Mood is anxious.        Behavior: Behavior normal. Behavior is cooperative.        Thought Content: Thought content normal.        Cognition and Memory: Cognition normal.        Judgment: Judgment normal.    Review of Systems  Constitutional: Negative.   HENT: Negative.    Eyes: Negative.   Respiratory: Negative.    Cardiovascular: Negative.   Musculoskeletal: Negative.   Skin: Negative.   Neurological: Negative.   Psychiatric/Behavioral:  The patient is nervous/anxious.    Blood pressure 126/75, pulse 87, temperature 98.6 F (37 C), temperature source Oral, resp. rate 16, SpO2 100 %. There is no height or weight on file to calculate BMI.  Musculoskeletal: Strength & Muscle Tone: within normal limits Gait & Station: normal Patient leans: N/A   [place] [facility] Discharge Disposition for Follow up and Recommendations: Based on my evaluation the patient does not appear to have an emergency medical condition and can be discharged with resources and follow up care in outpatient services for Medication Management and Individual Therapy  Discharge patient and resend IVC.  Patient will follow-up with her therapist within the week.  Resources given for outpatient psychiatric  services including medication management and therapy.   Revonda Humphrey, NP 05/14/2022, 2:34 PM

## 2022-05-14 NOTE — Progress Notes (Signed)
   05/14/22 1404  [place] Triage Screening (Walk-ins at [facility] only)  How Did You Hear About [place]? Legal System  What Is the Reason for Your Visit/Call Today? Betty Stone is a 22 y/o female that presents to the [facility] by [facility]. She is under IVC, by her mother [name]) [phone]. IVC reads: "Respondent is texting to boyfriend she is going to kill herself at 6:46pm". "She said I can't do this anymore.". "I'm gonna be gone by the time anyone gets me and I won't be alive". "I have access to oxycodone". Patient states that that boyfriend called her mom out of concern after she sent him this text and that's how her mother became involved. She reports on-going conflict amongst herself, her mother, and boyfriend. States that she felt overwhelmed yesterday when she sent the text. However, denies any intention/plan to harm herself. Denies current SI or hx of self any self injurious behaviors. Denies HI and AVH's. Reports social alcohol use. Denies illicit drug use. Her therapist is is [name]. No psychiatrist or history of inpatient treatment. 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  How Long Has This Been Causing You Problems? <Week  Have You Recently Had Any Thoughts About Hurting Yourself? Yes  How long ago did you have thoughts about hurting yourself? Yesterday.  Patient states that she sent a impulsive text to her boyfriend about hurting herself. However, denies that she had any intentions or plans.  Are You Planning to Commit Suicide/Harm Yourself At This time? No  Have you Recently Had Thoughts About [place]? No  Are You Planning To Harm Someone At This Time? No  Are you currently experiencing any auditory, visual or other hallucinations? No  Have You Used Any Alcohol or Drugs in the Past 24 Hours? No  Do you have any current medical co-morbidities that require immediate attention? No  Clinician description of patient physical appearance/behavior: Calm and cooperative.  What Do  You Feel Would Help You the Most Today? Treatment for Depression or other mood problem;Medication(s)  If access to [facility] was not available, would you have sought care in the Emergency Department? No  Determination of Need Routine (7 days)  Options For Referral Inpatient Hospitalization;Medication Management

## 2022-05-14 NOTE — Discharge Instructions (Addendum)
Leshara will reach out to her therapist , call 911 or call mobile crisis, or go to nearest emergency room if condition worsens or if suicidal thoughts become active Patients' will follow up with [facility] for outpatient psychiatric services (therapy/medication management).  The suicide prevention education provided includes the following: Suicide risk factors Suicide prevention and interventions National Suicide Hotline telephone number [facility] assessment telephone number [facility] Emergency Assistance [place] and/or Residential Mobile Crisis Unit telephone number Request made of family/significant other to:  patient and her boyfriend Designer, industrial/product (e.g., guns, rifles, knives), all items previously/currently identified as safety concern.   Remove drugs/medications (over the counter, prescriptions, illicit drugs), all items previously/currently identified as a safety concern.   Discussed methods to reduce the risk of self-injury or suicide attempts: Frequent conversations regarding unsafe thoughts. Remove all significant sharps. Remove all firearms. Remove all medications, including over-the-counter medications. Consider lockbox for medications and having a responsible person dispense medications until patient has strengthened coping skills. Room checks for sharps or other harmful objects. Secure all chemical substances that can be ingested or inhaled.    Continue activity as tolerated. Continue diet as recommended by your PCP. Ensure to keep all appointments with outpatient providers.

## 2022-06-14 DIAGNOSIS — Z113 Encounter for screening for infections with a predominantly sexual mode of transmission: Secondary | ICD-10-CM | POA: Diagnosis not present

## 2022-06-14 DIAGNOSIS — Z01419 Encounter for gynecological examination (general) (routine) without abnormal findings: Secondary | ICD-10-CM | POA: Diagnosis not present

## 2022-06-14 DIAGNOSIS — Z118 Encounter for screening for other infectious and parasitic diseases: Secondary | ICD-10-CM | POA: Diagnosis not present

## 2022-06-14 DIAGNOSIS — N760 Acute vaginitis: Secondary | ICD-10-CM | POA: Diagnosis not present

## 2022-06-26 ENCOUNTER — Encounter (HOSPITAL_BASED_OUTPATIENT_CLINIC_OR_DEPARTMENT_OTHER): Payer: Self-pay | Admitting: Emergency Medicine

## 2022-06-26 ENCOUNTER — Emergency Department (HOSPITAL_BASED_OUTPATIENT_CLINIC_OR_DEPARTMENT_OTHER): Payer: 59

## 2022-06-26 ENCOUNTER — Emergency Department (HOSPITAL_BASED_OUTPATIENT_CLINIC_OR_DEPARTMENT_OTHER)
Admission: EM | Admit: 2022-06-26 | Discharge: 2022-06-26 | Disposition: A | Discharge status: Home / Self Care | Payer: 59 | Attending: Emergency Medicine | Admitting: Emergency Medicine

## 2022-06-26 ENCOUNTER — Other Ambulatory Visit: Payer: Self-pay

## 2022-06-26 DIAGNOSIS — N200 Calculus of kidney: Secondary | ICD-10-CM | POA: Diagnosis not present

## 2022-06-26 DIAGNOSIS — R1084 Generalized abdominal pain: Secondary | ICD-10-CM | POA: Diagnosis not present

## 2022-06-26 DIAGNOSIS — R112 Nausea with vomiting, unspecified: Secondary | ICD-10-CM | POA: Diagnosis not present

## 2022-06-26 DIAGNOSIS — R197 Diarrhea, unspecified: Secondary | ICD-10-CM | POA: Insufficient documentation

## 2022-06-26 DIAGNOSIS — R109 Unspecified abdominal pain: Secondary | ICD-10-CM | POA: Diagnosis not present

## 2022-06-26 LAB — URINALYSIS, MICROSCOPIC (REFLEX)

## 2022-06-26 LAB — COMPREHENSIVE METABOLIC PANEL
ALT: 13 U/L (ref 0–44)
AST: 20 U/L (ref 15–41)
Albumin: 4.6 g/dL (ref 3.5–5.0)
Alkaline Phosphatase: 61 U/L (ref 38–126)
Anion gap: 7 (ref 5–15)
BUN: 11 mg/dL (ref 6–20)
CO2: 19 mmol/L — ABNORMAL LOW (ref 22–32)
Calcium: 9.3 mg/dL (ref 8.9–10.3)
Chloride: 108 mmol/L (ref 98–111)
Creatinine, Ser: 0.56 mg/dL (ref 0.44–1.00)
GFR, Estimated: >60 mL/min (ref >60)
Glucose, Bld: 99 mg/dL (ref 70–99)
Potassium: 3.8 mmol/L (ref 3.5–5.1)
Sodium: 134 mmol/L — ABNORMAL LOW (ref 135–145)
Total Bilirubin: 0.9 mg/dL (ref 0.3–1.2)
Total Protein: 8.2 g/dL — ABNORMAL HIGH (ref 6.5–8.1)

## 2022-06-26 LAB — URINALYSIS, ROUTINE W REFLEX MICROSCOPIC
Bilirubin Urine: NEGATIVE
Glucose, UA: NEGATIVE mg/dL
Ketones, ur: NEGATIVE mg/dL
Leukocytes,Ua: NEGATIVE
Nitrite: NEGATIVE
Protein, ur: NEGATIVE mg/dL
Specific Gravity, Urine: >=1.03 (ref 1.005–1.030)
pH: 5.5 (ref 5.0–8.0)

## 2022-06-26 LAB — LIPASE, BLOOD: Lipase: 28 U/L (ref 11–51)

## 2022-06-26 LAB — CBC
HCT: 43.6 % (ref 36.0–46.0)
Hemoglobin: 15.3 g/dL — ABNORMAL HIGH (ref 12.0–15.0)
MCH: 31.8 pg (ref 26.0–34.0)
MCHC: 35.1 g/dL (ref 30.0–36.0)
MCV: 90.6 fL (ref 80.0–100.0)
Platelets: 313 10*3/uL (ref 150–400)
RBC: 4.81 MIL/uL (ref 3.87–5.11)
RDW: 12 % (ref 11.5–15.5)
WBC: 15.9 10*3/uL — ABNORMAL HIGH (ref 4.0–10.5)
nRBC: 0 % (ref 0.0–0.2)

## 2022-06-26 LAB — PREGNANCY, URINE: Preg Test, Ur: NEGATIVE

## 2022-06-26 MED ORDER — ONDANSETRON 4 MG PO TBDP: 4.0000 mg | ORAL_TABLET | Freq: Three times a day (TID) | ORAL | 1 refills | Status: DC | PRN

## 2022-06-26 MED ORDER — ONDANSETRON HCL 4 MG/2ML IJ SOLN
4.0000 mg | Freq: Once | INTRAMUSCULAR | Status: AC
  Administered 2022-06-26: 4 mg via INTRAVENOUS
  Filled 2022-06-26: qty 2

## 2022-06-26 MED ORDER — SODIUM CHLORIDE 0.9 % IV SOLN: INTRAVENOUS | Status: DC

## 2022-06-26 MED ORDER — SODIUM CHLORIDE 0.9 % IV BOLUS
1000.0000 mL | Freq: Once | INTRAVENOUS | Status: AC
  Administered 2022-06-26: 1000 mL via INTRAVENOUS

## 2022-06-26 MED ORDER — LOPERAMIDE HCL 2 MG PO TABS: 2.0000 mg | ORAL_TABLET | Freq: Four times a day (QID) | ORAL | 0 refills | Status: DC | PRN

## 2022-06-26 MED ORDER — IOHEXOL 300 MG/ML  SOLN
100.0000 mL | Freq: Once | INTRAMUSCULAR | Status: AC | PRN
  Administered 2022-06-26: 100 mL via INTRAVENOUS

## 2022-06-26 NOTE — ED Triage Notes (Signed)
Vomited today and  generalized abd pain started today ,  had diarrhea yesterday felt dizzy at first

## 2022-06-26 NOTE — ED Notes (Signed)
Pt states feels better.

## 2022-06-26 NOTE — ED Provider Notes (Addendum)
[place] EMERGENCY DEPARTMENT AT [facility] Provider Note   CSN: [id number] Arrival date & time: 06/26/22  1008     History  Chief Complaint  Patient presents with   Emesis    Betty Stone is a 22 y.o. female.  Patient with onset of diarrhea type illness last evening after eating Janine Limbo.  Had diarrhea throughout the night.  This morning has generalized abdominal pain and has had vomiting.  Past medical history noncontributory.  Other than a history of eczema.  Patient is not a user of tobacco.  Patient denies any blood in the vomit or diarrhea.       Home Medications Prior to Admission medications   Medication Sig Start Date End Date Taking? Authorizing Provider  azithromycin (ZITHROMAX) 250 MG tablet Take 2 tablets PO on day one, and one tablet PO daily thereafter until completed. 01/19/21   Mar Daring, PA-C  doxycycline (VIBRA-TABS) 100 MG tablet Take 1 tablet (100 mg total) by mouth 2 (two) times daily. 01/09/21   Brunetta Jeans, PA-C  ipratropium (ATROVENT) 0.03 % nasal spray Place 2 sprays into both nostrils every 12 (twelve) hours. 01/19/21   Mar Daring, PA-C  lidocaine (XYLOCAINE) 2 % solution 5 mL swish and gargle for 15-20 seconds then swallow every 4 hours as needed for throat pain 01/19/21   Mar Daring, PA-C  phenazopyridine (PYRIDIUM) 100 MG tablet Take 1 tablet (100 mg total) by mouth 3 (three) times daily as needed for pain. 05/28/20   Jacquelin Hawking, NP  predniSONE (DELTASONE) 20 MG tablet Take 1 tablet (20 mg total) by mouth daily with breakfast. 01/19/21   Mar Daring, PA-C      Allergies    Amoxicillin and Cefdinir    Review of Systems   Review of Systems  Constitutional:  Negative for chills and fever.  HENT:  Negative for ear pain and sore throat.   Eyes:  Negative for pain and visual disturbance.  Respiratory:  Negative for cough and shortness of breath.   Cardiovascular:  Negative for chest  pain and palpitations.  Gastrointestinal:  Positive for abdominal pain, diarrhea, nausea and vomiting.  Genitourinary:  Negative for dysuria and hematuria.  Musculoskeletal:  Negative for arthralgias and back pain.  Skin:  Negative for color change and rash.  Neurological:  Negative for seizures and syncope.  All other systems reviewed and are negative.   Physical Exam Updated Vital Signs BP 125/75 (BP Location: Right Arm)   Pulse 93   Temp 98.4 F (36.9 C) (Oral)   Resp 20   Ht 1.676 m (5\' 6" )   Wt 54.4 kg   SpO2 99%   BMI 19.37 kg/m  Physical Exam Vitals and nursing note reviewed.  Constitutional:      General: She is not in acute distress.    Appearance: She is well-developed.  HENT:     Head: Normocephalic and atraumatic.     Mouth/Throat:     Mouth: Mucous membranes are moist.  Eyes:     Conjunctiva/sclera: Conjunctivae normal.  Cardiovascular:     Rate and Rhythm: Normal rate and regular rhythm.     Heart sounds: No murmur heard. Pulmonary:     Effort: Pulmonary effort is normal. No respiratory distress.     Breath sounds: Normal breath sounds.  Abdominal:     General: There is no distension.     Palpations: Abdomen is soft.     Tenderness: There is no  abdominal tenderness.  Musculoskeletal:        General: No swelling.     Cervical back: Neck supple.  Skin:    General: Skin is warm and dry.     Capillary Refill: Capillary refill takes less than 2 seconds.  Neurological:     Mental Status: She is alert.  Psychiatric:        Mood and Affect: Mood normal.     ED Results / Procedures / Treatments   Labs (all labs ordered are listed, but only abnormal results are displayed) Labs Reviewed  LIPASE, BLOOD  COMPREHENSIVE METABOLIC PANEL  CBC  URINALYSIS, ROUTINE W REFLEX MICROSCOPIC  PREGNANCY, URINE    EKG None  Radiology No results found.  Procedures Procedures    Medications Ordered in ED Medications  0.9 %  sodium chloride infusion (has  no administration in time range)  sodium chloride 0.9 % bolus 1,000 mL (has no administration in time range)  ondansetron (ZOFRAN) injection 4 mg (has no administration in time range)    ED Course/ Medical Decision Making/ A&P                             Medical Decision Making Amount and/or Complexity of Data Reviewed Labs: ordered. Radiology: ordered.  Risk OTC drugs. Prescription drug management.   Patient's abdomen soft and nontender no acute distress.  Symptoms could be secondary to a viral gastroenteritis.  But since has generalized abdominal pain that just started this morning will CT.  Will get CBC complete metabolic panel urinalysis and pregnancy test.  Will give IV fluids.  Will give antinausea medicine.  Labs without significant abnormalities other than some leukocytosis.  Lipase liver function test normal electrolytes without significant abnormalities renal function normal.  Urinalysis negative and pregnancy test negative.  CT negative except for the finding of a left renal pole stone.  Not playing any role in today's symptoms.  Will treat for the diarrhea with Imodium and Zofran ODT for the nausea and vomiting.  Work note provided.  Patient nontoxic no acute distress stable for discharge home.   Final Clinical Impression(s) / ED Diagnoses Final diagnoses:  Nausea vomiting and diarrhea  Generalized abdominal pain    Rx / DC Orders ED Discharge Orders     None         Fredia Sorrow, MD 06/26/22 1046    Fredia Sorrow, MD 06/26/22 1300

## 2022-06-26 NOTE — ED Notes (Signed)
Pt states she is ok to drive, I asked 4 times she states her mom not going states feels better

## 2022-06-26 NOTE — Discharge Instructions (Signed)
Workup labs and CT scan without any acute findings.  Incidental finding of a left kidney stone.  That could cause problems in the future.  But not related to today's complaints.  Symptoms seem to be consistent with a gastroenteritis.  Will give you a prescription for the Zofran for the nausea and vomiting and Imodium AD for the diarrhea.

## 2022-07-02 DIAGNOSIS — R109 Unspecified abdominal pain: Secondary | ICD-10-CM | POA: Diagnosis not present

## 2022-07-02 DIAGNOSIS — R110 Nausea: Secondary | ICD-10-CM | POA: Diagnosis not present

## 2022-10-24 DIAGNOSIS — F418 Other specified anxiety disorders: Secondary | ICD-10-CM | POA: Diagnosis not present

## 2022-10-24 DIAGNOSIS — M542 Cervicalgia: Secondary | ICD-10-CM | POA: Diagnosis not present

## 2022-10-24 DIAGNOSIS — M545 Low back pain, unspecified: Secondary | ICD-10-CM | POA: Diagnosis not present

## 2022-11-27 DIAGNOSIS — D225 Melanocytic nevi of trunk: Secondary | ICD-10-CM | POA: Diagnosis not present

## 2022-11-27 DIAGNOSIS — D2262 Melanocytic nevi of left upper limb, including shoulder: Secondary | ICD-10-CM | POA: Diagnosis not present

## 2022-11-27 DIAGNOSIS — D2261 Melanocytic nevi of right upper limb, including shoulder: Secondary | ICD-10-CM | POA: Diagnosis not present

## 2022-11-27 DIAGNOSIS — Z808 Family history of malignant neoplasm of other organs or systems: Secondary | ICD-10-CM | POA: Diagnosis not present

## 2022-11-27 DIAGNOSIS — D2272 Melanocytic nevi of left lower limb, including hip: Secondary | ICD-10-CM | POA: Diagnosis not present

## 2022-11-27 DIAGNOSIS — L814 Other melanin hyperpigmentation: Secondary | ICD-10-CM | POA: Diagnosis not present

## 2022-11-27 DIAGNOSIS — D2271 Melanocytic nevi of right lower limb, including hip: Secondary | ICD-10-CM | POA: Diagnosis not present

## 2022-12-19 DIAGNOSIS — H1032 Unspecified acute conjunctivitis, left eye: Secondary | ICD-10-CM | POA: Diagnosis not present

## 2023-01-13 DIAGNOSIS — F418 Other specified anxiety disorders: Secondary | ICD-10-CM | POA: Diagnosis not present

## 2023-01-13 DIAGNOSIS — R630 Anorexia: Secondary | ICD-10-CM | POA: Diagnosis not present

## 2023-01-13 DIAGNOSIS — Z862 Personal history of diseases of the blood and blood-forming organs and certain disorders involving the immune mechanism: Secondary | ICD-10-CM | POA: Diagnosis not present

## 2023-01-13 DIAGNOSIS — R6889 Other general symptoms and signs: Secondary | ICD-10-CM | POA: Diagnosis not present

## 2023-01-13 DIAGNOSIS — R634 Abnormal weight loss: Secondary | ICD-10-CM | POA: Diagnosis not present

## 2023-01-22 DIAGNOSIS — R61 Generalized hyperhidrosis: Secondary | ICD-10-CM | POA: Diagnosis not present

## 2023-01-22 DIAGNOSIS — R051 Acute cough: Secondary | ICD-10-CM | POA: Diagnosis not present

## 2023-01-22 DIAGNOSIS — J029 Acute pharyngitis, unspecified: Secondary | ICD-10-CM | POA: Diagnosis not present

## 2023-01-27 DIAGNOSIS — J3501 Chronic tonsillitis: Secondary | ICD-10-CM | POA: Diagnosis not present

## 2023-03-19 DIAGNOSIS — R519 Headache, unspecified: Secondary | ICD-10-CM | POA: Diagnosis not present

## 2023-03-19 DIAGNOSIS — R0981 Nasal congestion: Secondary | ICD-10-CM | POA: Diagnosis not present

## 2023-03-19 DIAGNOSIS — R110 Nausea: Secondary | ICD-10-CM | POA: Diagnosis not present

## 2023-04-05 DIAGNOSIS — R6883 Chills (without fever): Secondary | ICD-10-CM | POA: Diagnosis not present

## 2023-04-05 DIAGNOSIS — R0981 Nasal congestion: Secondary | ICD-10-CM | POA: Diagnosis not present

## 2023-04-05 DIAGNOSIS — J101 Influenza due to other identified influenza virus with other respiratory manifestations: Secondary | ICD-10-CM | POA: Diagnosis not present

## 2023-04-05 DIAGNOSIS — M791 Myalgia, unspecified site: Secondary | ICD-10-CM | POA: Diagnosis not present

## 2023-06-04 ENCOUNTER — Ambulatory Visit (INDEPENDENT_AMBULATORY_CARE_PROVIDER_SITE_OTHER): Admitting: Certified Nurse Midwife

## 2023-06-04 ENCOUNTER — Other Ambulatory Visit (HOSPITAL_COMMUNITY)
Admission: RE | Admit: 2023-06-04 | Discharge: 2023-06-04 | Disposition: A | Discharge status: Home / Self Care | Source: Ambulatory Visit | Attending: Certified Nurse Midwife | Admitting: Certified Nurse Midwife

## 2023-06-04 ENCOUNTER — Encounter (HOSPITAL_BASED_OUTPATIENT_CLINIC_OR_DEPARTMENT_OTHER): Payer: Self-pay | Admitting: Certified Nurse Midwife

## 2023-06-04 VITALS — BP 107/57 | HR 79 | Ht 66.0 in | Wt 115.6 lb

## 2023-06-04 DIAGNOSIS — Z124 Encounter for screening for malignant neoplasm of cervix: Secondary | ICD-10-CM

## 2023-06-04 DIAGNOSIS — Z30432 Encounter for removal of intrauterine contraceptive device: Secondary | ICD-10-CM

## 2023-06-04 DIAGNOSIS — N898 Other specified noninflammatory disorders of vagina: Secondary | ICD-10-CM | POA: Insufficient documentation

## 2023-06-04 DIAGNOSIS — Z30430 Encounter for insertion of intrauterine contraceptive device: Secondary | ICD-10-CM

## 2023-06-04 DIAGNOSIS — Z30433 Encounter for removal and reinsertion of intrauterine contraceptive device: Secondary | ICD-10-CM

## 2023-06-04 MED ORDER — LEVONORGESTREL 19.5 MG IU IUD: INTRAUTERINE_SYSTEM | Freq: Once | INTRAUTERINE | Status: AC

## 2023-06-04 MED ORDER — FLUCONAZOLE 150 MG PO TABS: 150.0000 mg | ORAL_TABLET | ORAL | 2 refills | Status: DC | PRN

## 2023-06-04 MED ORDER — METRONIDAZOLE 0.75 % VA GEL: 1.0000 | Freq: Every day | VAGINAL | 4 refills | Status: AC

## 2023-06-04 NOTE — Progress Notes (Unsigned)
  Has Kyleena IUD. Pt requests removal of Kyleena and insertion new Kyleena IUD.  No prior pap smears.  Vaginal odor.  Pt reports issue with recurrent BV.

## 2023-06-05 DIAGNOSIS — N898 Other specified noninflammatory disorders of vagina: Secondary | ICD-10-CM | POA: Insufficient documentation

## 2023-06-05 DIAGNOSIS — Z30430 Encounter for insertion of intrauterine contraceptive device: Secondary | ICD-10-CM | POA: Insufficient documentation

## 2023-06-06 LAB — CERVICOVAGINAL ANCILLARY ONLY
Bacterial Vaginitis (gardnerella): POSITIVE — AB
Candida Glabrata: NEGATIVE
Candida Vaginitis: POSITIVE — AB
Comment: NEGATIVE
Comment: NEGATIVE
Comment: NEGATIVE

## 2023-06-09 ENCOUNTER — Encounter (HOSPITAL_BASED_OUTPATIENT_CLINIC_OR_DEPARTMENT_OTHER): Payer: Self-pay | Admitting: Certified Nurse Midwife

## 2023-06-09 LAB — CYTOLOGY - PAP
Chlamydia: NEGATIVE
Comment: NEGATIVE
Comment: NEGATIVE
Comment: NEGATIVE
Comment: NEGATIVE
Comment: NEGATIVE
Comment: NORMAL
Diagnosis: NEGATIVE
Diagnosis: REACTIVE
HPV 16: NEGATIVE
HPV 18 / 45: NEGATIVE
High risk HPV: POSITIVE — AB
Neisseria Gonorrhea: NEGATIVE
Trichomonas: NEGATIVE

## 2023-07-22 ENCOUNTER — Telehealth: Payer: Self-pay | Admitting: Physician Assistant

## 2023-07-22 DIAGNOSIS — R3989 Other symptoms and signs involving the genitourinary system: Secondary | ICD-10-CM | POA: Diagnosis not present

## 2023-07-22 MED ORDER — NITROFURANTOIN MONOHYD MACRO 100 MG PO CAPS: 100.0000 mg | ORAL_CAPSULE | Freq: Two times a day (BID) | ORAL | 0 refills | Status: DC

## 2023-07-22 NOTE — Progress Notes (Signed)

## 2023-08-05 ENCOUNTER — Ambulatory Visit (HOSPITAL_BASED_OUTPATIENT_CLINIC_OR_DEPARTMENT_OTHER): Admitting: Certified Nurse Midwife

## 2023-08-10 ENCOUNTER — Telehealth: Admitting: Physician Assistant

## 2023-08-10 ENCOUNTER — Encounter: Payer: Self-pay | Admitting: Physician Assistant

## 2023-08-10 DIAGNOSIS — N3000 Acute cystitis without hematuria: Secondary | ICD-10-CM

## 2023-08-10 MED ORDER — SULFAMETHOXAZOLE-TRIMETHOPRIM 800-160 MG PO TABS: 1.0000 | ORAL_TABLET | Freq: Two times a day (BID) | ORAL | 0 refills | Status: DC

## 2023-08-10 MED ORDER — SULFAMETHOXAZOLE-TRIMETHOPRIM 800-160 MG PO TABS: 1.0000 | ORAL_TABLET | Freq: Two times a day (BID) | ORAL | 0 refills | Status: AC

## 2023-08-10 NOTE — Progress Notes (Signed)
 E-Visit for Urinary Problems  We are sorry that you are not feeling well.  Here is how we plan to help!  Based on what you shared with me it looks like you most likely have a simple urinary tract infection.  A UTI (Urinary Tract Infection) is a bacterial infection of the bladder.  Most cases of urinary tract infections are simple to treat but a key part of your care is to encourage you to drink plenty of fluids and watch your symptoms carefully.  I have prescribed Bactrim  twice daily for 5 days.  Your symptoms should gradually improve. Call us  if the burning in your urine worsens, you develop worsening fever, back pain or pelvic pain or if your symptoms do not resolve after completing the antibiotic.  Urinary tract infections can be prevented by drinking plenty of water to keep your body hydrated.  Also be sure when you wipe, wipe from front to back and don't hold it in!  If possible, empty your bladder every 4 hours.  HOME CARE Drink plenty of fluids Compete the full course of the antibiotics even if the symptoms resolve Remember, when you need to go.go. Holding in your urine can increase the likelihood of getting a UTI! GET HELP RIGHT AWAY IF: You cannot urinate You get a high fever Worsening back pain occurs You see blood in your urine You feel sick to your stomach or throw up You feel like you are going to pass out  MAKE SURE YOU  Understand these instructions. Will watch your condition. Will get help right away if you are not doing well or get worse.   Thank you for choosing an e-visit.  Your e-visit answers were reviewed by a board certified advanced clinical practitioner to complete your personal care plan. Depending upon the condition, your plan could have included both over the counter or prescription medications.  Please review your pharmacy choice. Make sure the pharmacy is open so you can pick up prescription now. If there is a problem, you may contact your provider  through [employer] and have the prescription routed to another pharmacy.  Your safety is important to us . If you have drug allergies check your prescription carefully.   For the next 24 hours you can use MyChart to ask questions about today's visit, request a non-urgent call back, or ask for a work or school excuse. You will get an email in the next two days asking about your experience. I hope that your e-visit has been valuable and will speed your recovery.  I have spent 5 minutes in review of e-visit questionnaire, review and updating patient chart, medical decision making and response to patient.   Etter Hermann Mayers, PA-C

## 2023-08-12 ENCOUNTER — Telehealth: Admitting: Family Medicine

## 2023-08-12 DIAGNOSIS — B379 Candidiasis, unspecified: Secondary | ICD-10-CM

## 2023-08-12 MED ORDER — FLUCONAZOLE 150 MG PO TABS: 150.0000 mg | ORAL_TABLET | Freq: Every day | ORAL | 0 refills | Status: AC

## 2023-08-12 NOTE — Progress Notes (Signed)
